# Patient Record
Sex: Male | Born: 1935 | Race: Asian | Hispanic: No | Marital: Single
Health system: Southern US, Community
[De-identification: ages and names within clinical notes are randomized; demographics above are authoritative.]

---

## 2013-05-21 ENCOUNTER — Inpatient Hospital Stay: Payer: Self-pay | Admitting: Internal Medicine

## 2013-05-21 LAB — CBC
HCT: 29.6 % — AB (ref 40.0–52.0)
HGB: 10.1 g/dL — ABNORMAL LOW (ref 13.0–18.0)
MCH: 30.9 pg (ref 26.0–34.0)
MCHC: 34.1 g/dL (ref 32.0–36.0)
MCV: 91 fL (ref 80–100)
Platelet: 361 10*3/uL (ref 150–440)
RBC: 3.27 10*6/uL — ABNORMAL LOW (ref 4.40–5.90)
RDW: 12.6 % (ref 11.5–14.5)
WBC: 12.5 10*3/uL — ABNORMAL HIGH (ref 3.8–10.6)

## 2013-05-21 LAB — URINALYSIS, COMPLETE
Bacteria: NONE SEEN
Bilirubin,UR: NEGATIVE
Glucose,UR: >=500 mg/dL (ref 0–75)
KETONE: NEGATIVE
Nitrite: NEGATIVE
PH: 6 (ref 4.5–8.0)
Protein: 30
RBC,UR: 8 /HPF (ref 0–5)
SQUAMOUS EPITHELIAL: NONE SEEN
Specific Gravity: 1.011 (ref 1.003–1.030)
WBC UR: 1063 /HPF (ref 0–5)

## 2013-05-21 LAB — COMPREHENSIVE METABOLIC PANEL
ALBUMIN: 2.6 g/dL — AB (ref 3.4–5.0)
AST: 16 U/L (ref 15–37)
Alkaline Phosphatase: 83 U/L
Anion Gap: 9 (ref 7–16)
BUN: 26 mg/dL — AB (ref 7–18)
Bilirubin,Total: 0.6 mg/dL (ref 0.2–1.0)
CALCIUM: 8.7 mg/dL (ref 8.5–10.1)
CREATININE: 1.97 mg/dL — AB (ref 0.60–1.30)
Chloride: 95 mmol/L — ABNORMAL LOW (ref 98–107)
Co2: 25 mmol/L (ref 21–32)
EGFR (Non-African Amer.): 31 — ABNORMAL LOW
GFR CALC AF AMER: 36 — AB
Glucose: 425 mg/dL — ABNORMAL HIGH (ref 65–99)
Osmolality: 282 (ref 275–301)
Potassium: 4.2 mmol/L (ref 3.5–5.1)
SGPT (ALT): 29 U/L (ref 12–78)
SODIUM: 129 mmol/L — AB (ref 136–145)
TOTAL PROTEIN: 7.5 g/dL (ref 6.4–8.2)

## 2013-05-21 LAB — HEMOGLOBIN A1C: Hemoglobin A1C: 9.8 % — ABNORMAL HIGH (ref 4.2–6.3)

## 2013-05-21 LAB — TROPONIN I: Troponin-I: <0.02 ng/mL

## 2013-05-21 LAB — LIPASE, BLOOD: Lipase: 147 U/L (ref 73–393)

## 2013-05-22 LAB — CBC WITH DIFFERENTIAL/PLATELET
BASOS ABS: 0.1 10*3/uL (ref 0.0–0.1)
Basophil %: 0.5 %
Eosinophil #: 0.2 10*3/uL (ref 0.0–0.7)
Eosinophil %: 1.4 %
HCT: 29.1 % — AB (ref 40.0–52.0)
HGB: 10 g/dL — ABNORMAL LOW (ref 13.0–18.0)
LYMPHS PCT: 19.3 %
Lymphocyte #: 2.3 10*3/uL (ref 1.0–3.6)
MCH: 30.8 pg (ref 26.0–34.0)
MCHC: 34.5 g/dL (ref 32.0–36.0)
MCV: 90 fL (ref 80–100)
Monocyte #: 0.8 x10 3/mm (ref 0.2–1.0)
Monocyte %: 7 %
Neutrophil #: 8.6 10*3/uL — ABNORMAL HIGH (ref 1.4–6.5)
Neutrophil %: 71.8 %
PLATELETS: 389 10*3/uL (ref 150–440)
RBC: 3.25 10*6/uL — ABNORMAL LOW (ref 4.40–5.90)
RDW: 12.4 % (ref 11.5–14.5)
WBC: 12 10*3/uL — ABNORMAL HIGH (ref 3.8–10.6)

## 2013-05-22 LAB — BASIC METABOLIC PANEL
Anion Gap: 9 (ref 7–16)
BUN: 25 mg/dL — ABNORMAL HIGH (ref 7–18)
CALCIUM: 7.9 mg/dL — AB (ref 8.5–10.1)
CHLORIDE: 100 mmol/L (ref 98–107)
CO2: 23 mmol/L (ref 21–32)
Creatinine: 1.85 mg/dL — ABNORMAL HIGH (ref 0.60–1.30)
GFR CALC AF AMER: 38 — AB
GFR CALC NON AF AMER: 33 — AB
GLUCOSE: 185 mg/dL — AB (ref 65–99)
OSMOLALITY: 274 (ref 275–301)
Potassium: 3.8 mmol/L (ref 3.5–5.1)
Sodium: 132 mmol/L — ABNORMAL LOW (ref 136–145)

## 2013-05-23 LAB — BASIC METABOLIC PANEL
Anion Gap: 8 (ref 7–16)
BUN: 23 mg/dL — AB (ref 7–18)
CO2: 23 mmol/L (ref 21–32)
CREATININE: 1.8 mg/dL — AB (ref 0.60–1.30)
Calcium, Total: 7.6 mg/dL — ABNORMAL LOW (ref 8.5–10.1)
Chloride: 105 mmol/L (ref 98–107)
EGFR (African American): 40 — ABNORMAL LOW
GFR CALC NON AF AMER: 34 — AB
GLUCOSE: 112 mg/dL — AB (ref 65–99)
Osmolality: 276 (ref 275–301)
Potassium: 3.9 mmol/L (ref 3.5–5.1)
SODIUM: 136 mmol/L (ref 136–145)

## 2013-05-23 LAB — URINE CULTURE

## 2013-05-23 LAB — PROTEIN / CREATININE RATIO, URINE
Creatinine, Urine: 40.8 mg/dL (ref 30.0–125.0)
PROTEIN, RANDOM URINE: 37 mg/dL — AB (ref 0–12)
PROTEIN/CREAT. RATIO: 907 mg/g{creat} — AB (ref 0–200)

## 2013-05-24 LAB — BASIC METABOLIC PANEL
Anion Gap: 9 (ref 7–16)
BUN: 26 mg/dL — AB (ref 7–18)
CO2: 21 mmol/L (ref 21–32)
Calcium, Total: 8.2 mg/dL — ABNORMAL LOW (ref 8.5–10.1)
Chloride: 104 mmol/L (ref 98–107)
Creatinine: 1.65 mg/dL — ABNORMAL HIGH (ref 0.60–1.30)
EGFR (African American): 44 — ABNORMAL LOW
EGFR (Non-African Amer.): 38 — ABNORMAL LOW
Glucose: 225 mg/dL — ABNORMAL HIGH (ref 65–99)
OSMOLALITY: 280 (ref 275–301)
POTASSIUM: 4.7 mmol/L (ref 3.5–5.1)
Sodium: 134 mmol/L — ABNORMAL LOW (ref 136–145)

## 2013-05-25 LAB — PROTEIN ELECTROPHORESIS(ARMC)

## 2013-05-25 LAB — KAPPA/LAMBDA FREE LIGHT CHAINS (ARMC)

## 2013-05-25 LAB — UR PROT ELECTROPHORESIS, URINE RANDOM

## 2014-07-08 NOTE — H&P (Signed)
PATIENT NAME:  Gary Dunn, Patrik S MR#:  [id number] DATE OF BIRTH:  01-Sep-1929  DATE OF ADMISSION:  05/21/2013  ADMITTING PHYSICIAN: Enid Baasadhika Shondrea Steinert, M.D.   PRIMARY CARE PHYSICIAN: [facility] [place].   CHIEF COMPLAINT: Feeling sick and nauseated and sugars being high.   HISTORY OF PRESENT ILLNESS: Mr. Gary Dunn is an 81 year old Asian male with past medical history significant for hypertension, insulin-dependent diabetes mellitus who presented to the hospital, brought in by family secondary to feeling sick and sugars being high for the last few days. The patient had traveled internationally, to [place] about 2 weeks ago and stayed there for 2 weeks.  There he felt sick, nauseated, did not feel good. His family was out touring.  He was in the hotel room.  He had appetite and thought his sugars were low, so he did not take his insulin like he was supposed to. They just return from the trip about 2 days ago and since then he has noticed that his sugars were running high and he actually called his daughter this morning saying that he had a low-grade fever, did not feel good and had to be brought to the Emergency Room. The patient appears dehydrated here.  He had as a low-grade fever of 99 degrees Fahrenheit and noted to have some lab abnormalities including a low sodium, elevated creatinine and his sugars were in the 400s consistently.  The patient denies any chest pain, cough, dysuria, nausea or vomiting at this time.   PAST MEDICAL HISTORY: 1.  Hypertension.  2.  Diabetes.  3.  Prostate cancer.   PAST SURGICAL HISTORY:  Prostatectomy.   ALLERGIES: CODEINE CAUSES RASH AND GI DISTRESS.   CURRENT HOME MEDICATIONS:   1.  The patient takes Humulin 70/30 insulin 15 units in the morning and 10 units at bedtime.  2.  Metformin unknown dose twice a day.  3.  Lisinopril.  4.  Hydrochlorothiazide.  5.  Other blood pressure medication.  All his home meds need to be verified as he did not bring his  medication list with him.   SOCIAL HISTORY: Lives at home with his son but independently in the basement.  Occasional drinking beer but no smoking.    FAMILY HISTORY:  Significant for diabetes in the family.   REVIEW OF SYSTEMS:  CONSTITUTIONAL: Positive for fever, fatigue and weakness.  EYES: Positive for blurry vision. No pain, inflammation, glaucoma or cataracts.  ENT: No tinnitus, ear pain, hearing loss, epistaxis or discharge.  RESPIRATORY: No cough, wheeze, hemoptysis or COPD. CARDIOVASCULAR: No chest pain, orthopnea, edema, arrhythmia, palpitations or syncope.  GASTROINTESTINAL: Positive for nausea. No vomiting, diarrhea, abdominal pain, hematemesis, or melena.  GENITOURINARY: No dysuria, hematuria, renal calculus or increased frequency of urination.  ENDOCRINE: No polyuria, nocturia, thyroid problems, heat or cold intolerance.  HEMATOLOGY: No anemia, easy bruising or bleeding.  SKIN: No acne, rash or lesions.  MUSCULOSKELETAL: No neck, back, shoulder pain, arthritis or gout.  NEUROLOGIC: No numbness, weakness, CVA, TIA or seizures.  PSYCHOLOGICAL: No anxiety, insomnia or depression.   PHYSICAL EXAMINATION: VITAL SIGNS: Temperature 99 degrees Fahrenheit, pulse 74, respirations 20, blood pressure 156/67, pulse ox 100% on room air.  GENERAL: Well-built, well-nourished male lying in bed, not in any acute distress.  HEENT: Normocephalic, atraumatic. Pupils equal, round, reacting to light. Anicteric sclerae. Extraocular movements intact. Oropharynx clear without erythema, mass or exudates.  NECK: Supple. No thyromegaly, JVD or carotid bruits. No lymphadenopathy.  LUNGS: Moving air bilaterally.  No  wheezes or crackles. No use of accessory muscles for breathing.  CARDIOVASCULAR: S1, S2 regular rate and rhythm. No murmurs, rubs, or gallops.  ABDOMEN: Soft, nontender, nondistended. No hepatosplenomegaly. Normal bowel sounds.  EXTREMITIES: No pedal edema. No clubbing or cyanosis, 2+  dorsalis pedis pulses palpable bilaterally.  SKIN: No acne, rash or lesions.  LYMPHATICS: No cervical or inguinal lymphadenopathy.  NEUROLOGIC: Cranial nerves II through XII remain intact. No gross motor or sensory deficits.  PSYCHOLOGICAL: The patient is awake, alert, oriented x3.   LABORATORY DATA: WBC is 12.5, hemoglobin 10.1, hematocrit 29.6, platelet count 361.  Sodium 129, potassium 4.2, chloride 95, bicarbonate 25, BUN 26, creatinine 1.97, glucose 125 and calcium of 8.7.   ALT 29, AST 16, alkaline phosphatase 83, total bilirubin 0.6 and albumin 2.6. Urinalysis with 2+ blood, 30 mg/dl of protein, 3+ leukocyte esterase, several WBCs and bacteria. Chest x-ray showing no evidence of cardiopulmonary disease. No pleural effusions or infiltrates seen. Mild apical pleural parenchyma thickening which could be inflammatory or postinfectious noted. Atherosclerotic aortic changes  noted.  Troponins are negative. Lipase 147.    EKG showing normal sinus rhythm, heart rate of 68. No acute ST-T wave elevations.   ASSESSMENT AND PLAN: An 81 year old male with hypertension and diabetes who has been noncompliant with his insulin for 2 weeks now, admitted, noted to have hyperglycemia,  hyponatremia and acute renal failure.  1.  Hyponatremia and acute renal failure, likely hypovolemic in nature and pre-renal causes. We will admit for IV fluids and recheck his labs in the a.m. He does not have previous labs to compare with.  We will hold his lisinopril and HCTZ and monitor.  2.  Uncontrolled diabetes mellitus. Due to noncompliance recently. Check HbA1c. Continue his Humulin insulin, increase to 15 units b.i.d. and sliding scale insulin and adjust as needed.  3.  Urinary tract infection. Sent for cultures and start on Rocephin.  4.  Hypertension. His meds are held, as mentioned above. Home meds need to be verified and continue to monitor blood pressure and adjust his medications as needed.  5.  CODE STATUS: FULL  CODE.   TIME SPENT ON ADMISSION: 50 minutes.   ____________________________ Enid Baas, MD [id number] D: 05/21/2013 14:47:21 ET T: 05/21/2013 15:40:11 ET JOB#: [id number]  cc: Enid Baas, MD, <Dictator> Janece Canterbury, MD Enid Baas MD ELECTRONICALLY SIGNED 05/21/2013 16:22

## 2014-07-08 NOTE — Discharge Summary (Signed)
PATIENT NAME:  Gary Dunn, [name] MR#:  [id number] DATE OF BIRTH:  1930/02/16  DATE OF ADMISSION:  05/21/2013 DATE OF DISCHARGE:  05/24/2013  PRIMARY CARE PHYSICIAN:  Janece CanterburyVinay C. Reddy, MD  FINAL DIAGNOSES: 1.  Systemic inflammatory response syndrome.  2.  Urinary tract infection.  3.  Hyponatremia.  4.  Acute renal failure on chronic kidney disease, stage 3  5.  Hypertension.  6.  Uncontrolled diabetes.   MEDICATIONS ON DISCHARGE: Include amlodipine 5 mg daily, insulin aspart 30/70, 15 units subcutaneous injection twice a day, cephalexin 250 mg 1 capsule 3 times a day for 7 days, colchicine 0.6 mg every 48 hours as needed for gout. Stop metformin, stop lisinopril, stop hydrochlorothiazide.   DIET: Low sodium diet, regular consistency.   ACTIVITY: As tolerated.   FOLLOWUP: With Dr. Thedore MinsSingh, nephrology, 3 to 4 weeks; 1 to 2 weeks with Dr. Betti Cruzeddy.   HOSPITAL COURSE: The patient was admitted May 21, 2013, with feeling sick, nauseated and sugars being high. Was admitted with hyponatremia and acute renal failure and given IV fluid hydration. Lisinopril and hydrochlorothiazide was held. For his uncontrolled diabetes, he was put back on his 70/30, 15 units twice a day. For his UTI, he was started on Rocephin.   LABORATORY, DIAGNOSTIC AND RADIOLOGICAL DATA DURING THE HOSPITAL COURSE: Included an EKG that showed normal sinus rhythm. Hemoglobin A1c 9.8. Lipase 147. Troponin negative. White blood cell count 12.5, H and H 10.1 and 29.6, platelet count of 361. Glucose 425, BUN 26, creatinine 1.97, sodium 129, potassium 4.2, chloride 95, CO2 25, calcium 8.7. Liver function tests normal range. Chest x-ray showed no evidence of cardiopulmonary disease. Urine culture grew out Citrobacter koseri. Urinalysis 3+ leukocyte esterase, 2+ blood. Creatinine on the 8th of 1.85, sodium 132. Creatinine on the 9th of 1.8, sodium 136. Ultrasound of the kidneys showed questionable bladder diverticulum, no bladder distention,  otherwise normal exam. Creatinine urine 40.8, protein creatinine ratio 907, random urine protein 37. Creatinine upon discharge 1.65, sodium 134.   HOSPITAL COURSE PER PROBLEM LIST:  1.  For the systemic inflammatory response syndrome. The patient had fever and leukocytosis. The patient's temperature curve came down, was afebrile at 98.3 upon discharge. White count 12 upon discharge. This was secondary to urinary tract infection.  2.  Urinary tract infection. Culture grew out Citrobacter sensitive to Rocephin. This was given during the hospital course, switched over to Keflex upon discharge.  3.  Hyponatremia, could be secondary to dehydration or hydrochlorothiazide. The hydrochlorothiazide was stopped. The patient was hydrated up. Sodium in the normal range upon discharge.  4.  Acute renal failure. This is on chronic kidney disease stage 3. The patient was given IV fluid hydration, did not improve as much as I initially thought. The daughter did fax over a baseline creatinine of 1.4 from Dr. Jae Direeddy's office so 1.65 is not so bad at this point in time. I did keep him off the hydrochlorothiazide and lisinopril. When follows up with nephrology, can consider replacing the Norvasc with lisinopril for further management.  5.  Hypertension. The patient was held off blood pressure medications but blood pressure started to creep up. I did start Norvasc 5 mg. The patient's blood pressure upon discharge 120/66.  6.  Uncontrolled diabetes. The patient's hemoglobin A1c is 9.8. The patient did get a dose of steroids on the 10th, which brought his sugars up. He did have gout. I stopped the steroids and I did give p.r.n. colchicine, but I did have to  cover him with a little more sliding scale while here. He can continue to go on his 70/30 insulin twice a day at home. Compliance with his medications needed. The patient was talking about getting an insulin pump as outpatient. He can be referred over to Dr. Tedd Sias, endocrine, if  he is interested in something like that.  7.  Gout. He was given colchicine p.r.n. if needed. The patient did not complain of any pain to me when I saw him on the 10th.   TIME SPENT ON DISCHARGE: 35 minutes.   ____________________________ Herschell Dimes. Renae Gloss, MD rjw:[id number] D: 05/24/2013 15:39:00 ET T: 05/24/2013 15:53:41 ET JOB#: [id number]  cc: Herschell Dimes. Renae Gloss, MD, <Dictator> Janece Canterbury, MD Salley Scarlet MD ELECTRONICALLY SIGNED 06/04/2013 15:08

## 2014-08-20 IMAGING — US US RENAL KIDNEY
1 series · 14 of 25 positions shown · non-contrast
Comparison: None.

CLINICAL DATA: Renal failure.

EXAM:
RENAL/URINARY TRACT ULTRASOUND COMPLETE

[Series 1: us renal kidney · 0.24mm/px · 53 acquisitions, 14 frames shown]
[im 1/53]
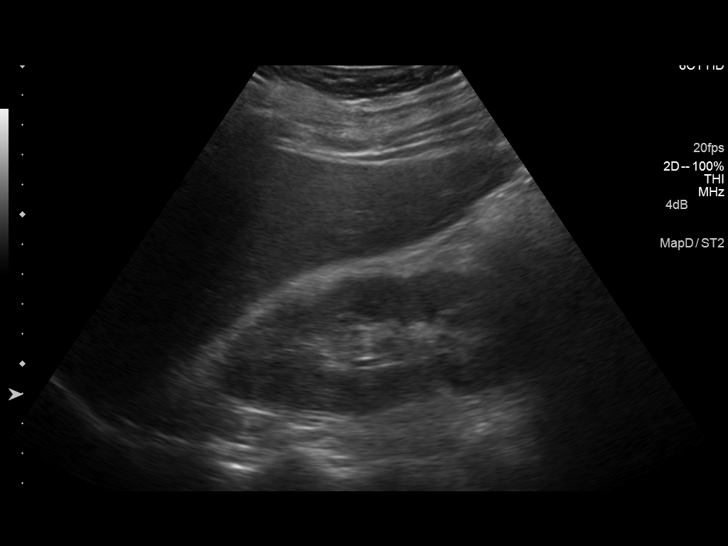
[im 5/53]
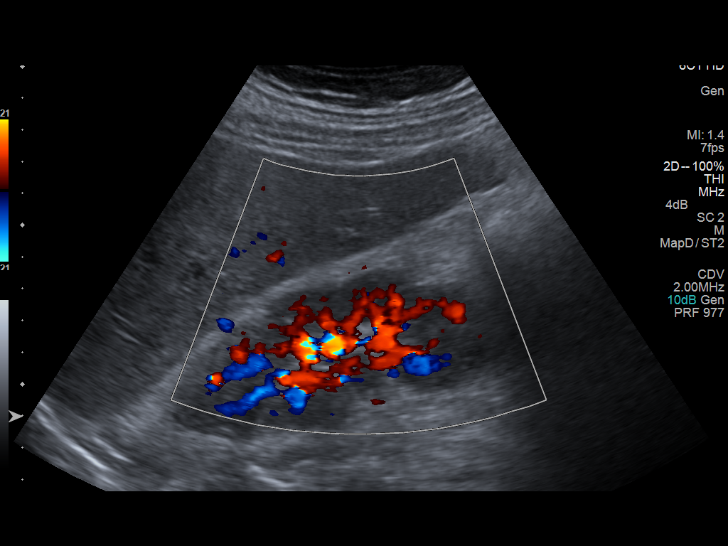
[im 9/53]
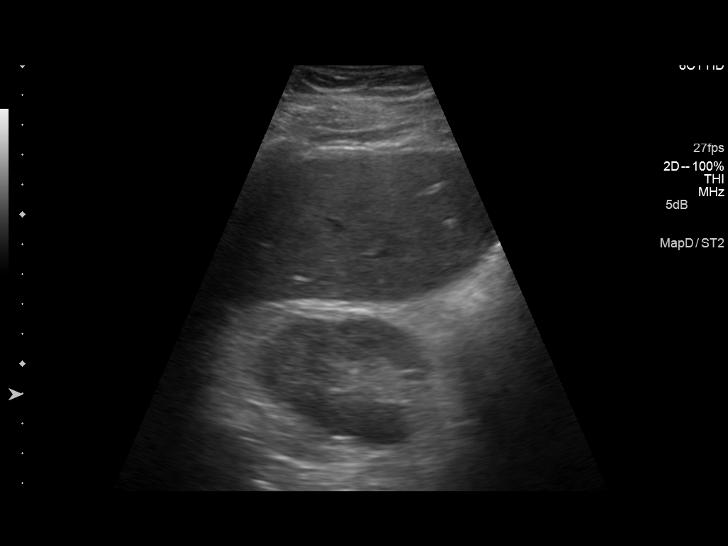
[im 14/53]
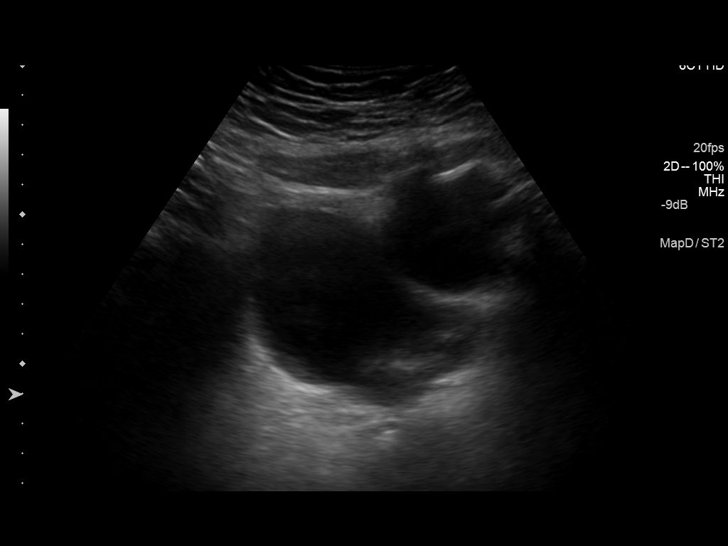
[im 18/53]
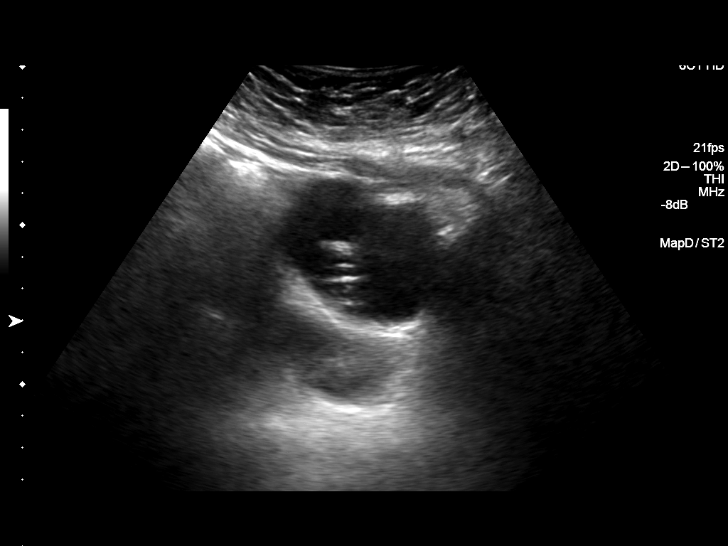
[im 20/53]
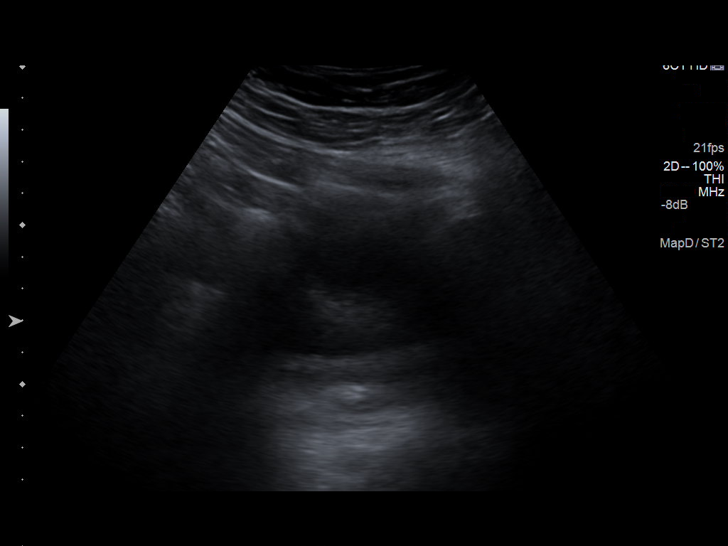
[im 24/53]
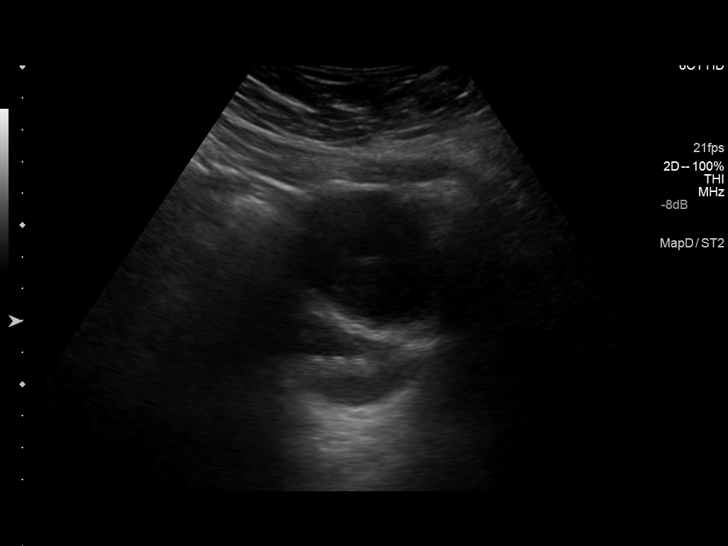
[im 29/53]
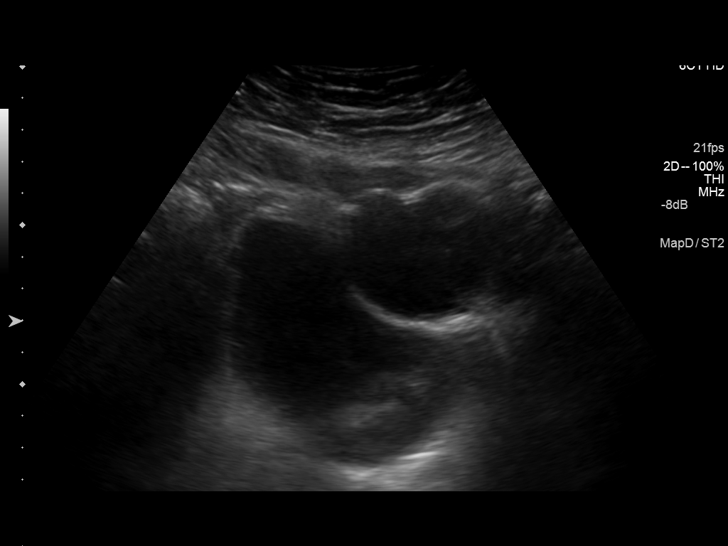
[im 33/53]
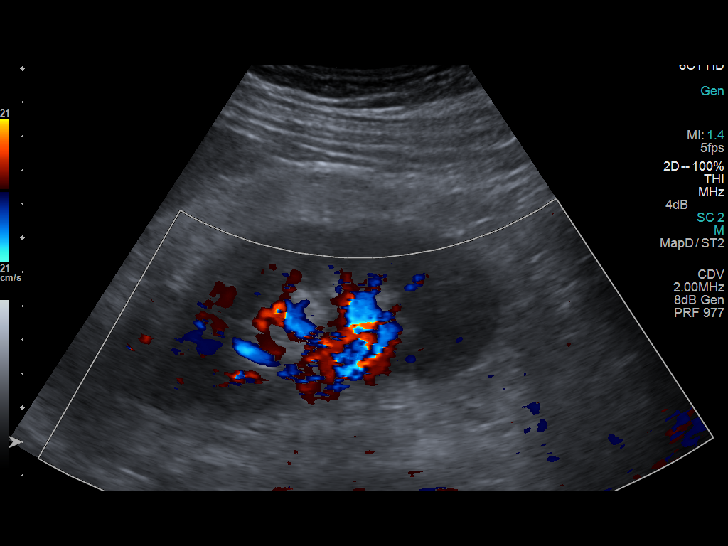
[im 35/53]
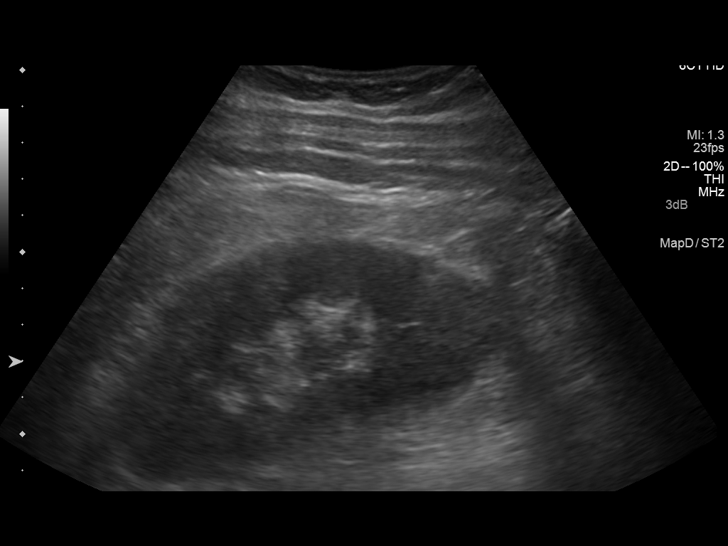
[im 40/53]
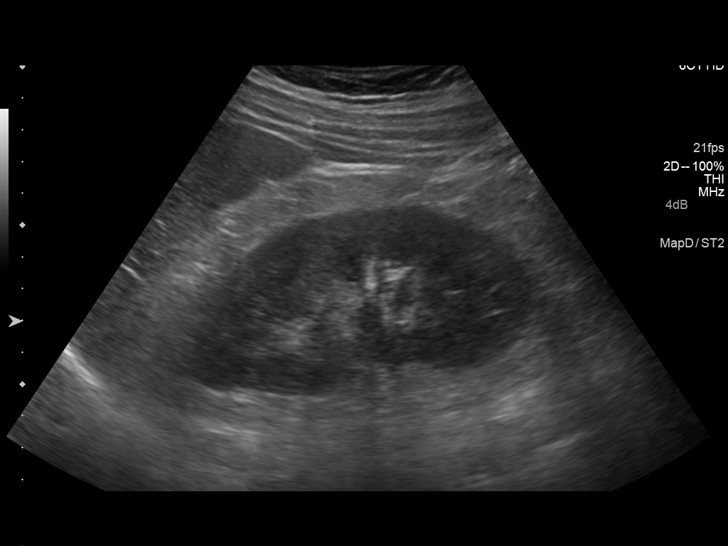
[im 44/53]
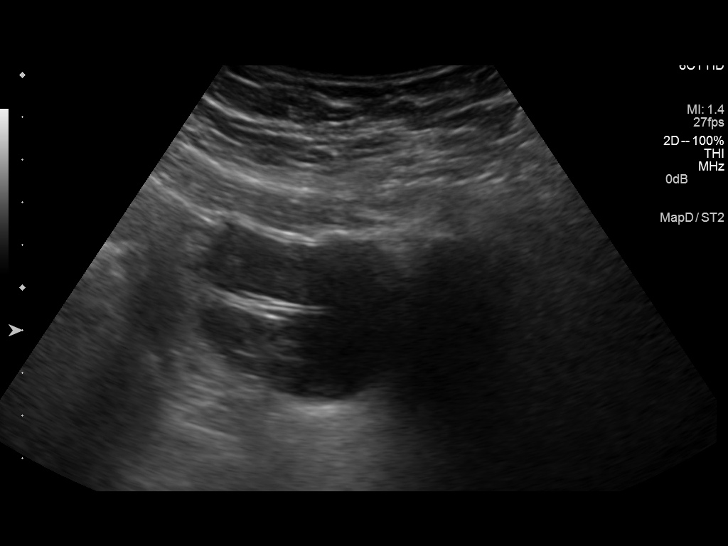
[im 48/53]
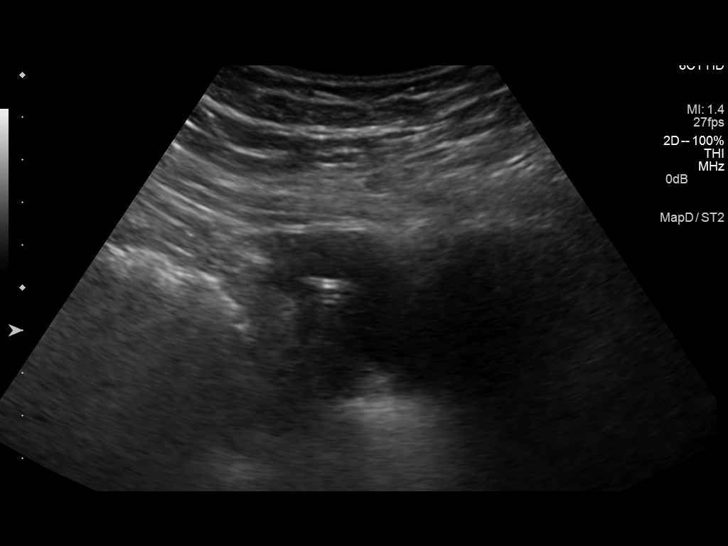
[im 53/53]
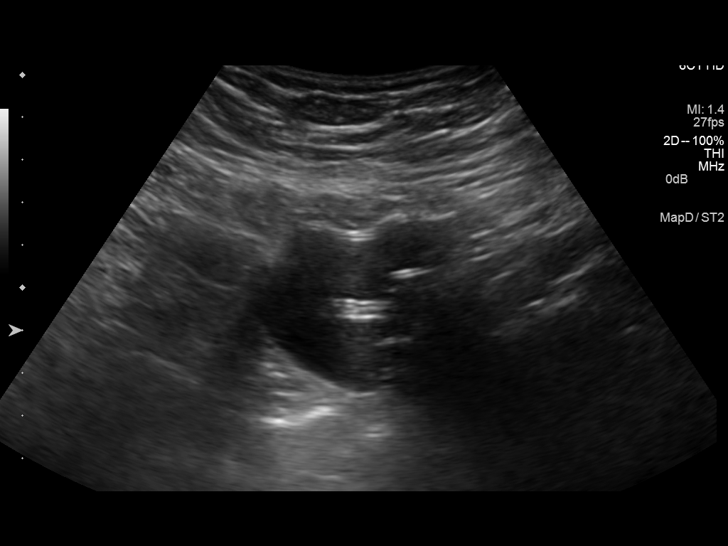

[14 of 25 positions shown; findings below may reference images not displayed]

FINDINGS: Right Kidney:

Length: 11.4 cm. Echogenicity within normal limits. No mass or
hydronephrosis visualized.

Left Kidney:

Length: 11.5 cm. Echogenicity within normal limits. No mass or
hydronephrosis visualized.

Bladder:

Questionable bladder diverticula are noted. Bladder is not
distended.
IMPRESSION: 1. Questionable bladder diverticulum.  No bladder distention.
2. Otherwise normal exam.
